# Patient Record
Sex: Female | Born: 1970 | Race: White | Hispanic: No | Marital: Married | Smoking: Never smoker
Health system: Southern US, Community
[De-identification: ages and names within clinical notes are randomized; demographics above are authoritative.]

## PROBLEM LIST (undated history)

## (undated) DIAGNOSIS — I10 Essential (primary) hypertension: Secondary | ICD-10-CM

## (undated) DIAGNOSIS — T7840XA Allergy, unspecified, initial encounter: Secondary | ICD-10-CM

## (undated) DIAGNOSIS — G43909 Migraine, unspecified, not intractable, without status migrainosus: Secondary | ICD-10-CM

## (undated) DIAGNOSIS — D649 Anemia, unspecified: Secondary | ICD-10-CM

## (undated) DIAGNOSIS — E162 Hypoglycemia, unspecified: Secondary | ICD-10-CM

## (undated) DIAGNOSIS — R011 Cardiac murmur, unspecified: Secondary | ICD-10-CM

## (undated) DIAGNOSIS — F32A Depression, unspecified: Secondary | ICD-10-CM

## (undated) DIAGNOSIS — O039 Complete or unspecified spontaneous abortion without complication: Secondary | ICD-10-CM

## (undated) DIAGNOSIS — B019 Varicella without complication: Secondary | ICD-10-CM

## (undated) DIAGNOSIS — F329 Major depressive disorder, single episode, unspecified: Secondary | ICD-10-CM

## (undated) DIAGNOSIS — Z973 Presence of spectacles and contact lenses: Secondary | ICD-10-CM

## (undated) DIAGNOSIS — F419 Anxiety disorder, unspecified: Secondary | ICD-10-CM

## (undated) HISTORY — DX: Anemia, unspecified: D64.9

## (undated) HISTORY — DX: Allergy, unspecified, initial encounter: T78.40XA

## (undated) HISTORY — DX: Complete or unspecified spontaneous abortion without complication: O03.9

## (undated) HISTORY — DX: Essential (primary) hypertension: I10

## (undated) HISTORY — DX: Varicella without complication: B01.9

## (undated) HISTORY — DX: Hypoglycemia, unspecified: E16.2

## (undated) HISTORY — DX: Depression, unspecified: F32.A

## (undated) HISTORY — DX: Anxiety disorder, unspecified: F41.9

## (undated) HISTORY — DX: Major depressive disorder, single episode, unspecified: F32.9

## (undated) HISTORY — DX: Migraine, unspecified, not intractable, without status migrainosus: G43.909

## (undated) HISTORY — DX: Presence of spectacles and contact lenses: Z97.3

## (undated) HISTORY — DX: Cardiac murmur, unspecified: R01.1

---
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
## 2003-08-17 ENCOUNTER — Other Ambulatory Visit: Admission: RE | Admit: 2003-08-17 | Discharge: 2003-08-17 | Payer: Self-pay | Admitting: Obstetrics and Gynecology

## 2004-02-21 ENCOUNTER — Other Ambulatory Visit: Admission: RE | Admit: 2004-02-21 | Discharge: 2004-02-21 | Payer: Self-pay | Admitting: Obstetrics and Gynecology

## 2004-07-11 ENCOUNTER — Other Ambulatory Visit: Admission: RE | Admit: 2004-07-11 | Discharge: 2004-07-11 | Payer: Self-pay | Admitting: Obstetrics and Gynecology

## 2004-11-21 ENCOUNTER — Other Ambulatory Visit: Admission: RE | Admit: 2004-11-21 | Discharge: 2004-11-21 | Payer: Self-pay | Admitting: Obstetrics and Gynecology

## 2005-07-28 ENCOUNTER — Inpatient Hospital Stay (HOSPITAL_COMMUNITY): Admission: AD | Admit: 2005-07-28 | Discharge: 2005-07-30 | Payer: Self-pay | Admitting: Obstetrics and Gynecology

## 2005-09-16 ENCOUNTER — Other Ambulatory Visit: Admission: RE | Admit: 2005-09-16 | Discharge: 2005-09-16 | Payer: Self-pay | Admitting: Obstetrics and Gynecology

## 2006-09-08 DIAGNOSIS — O039 Complete or unspecified spontaneous abortion without complication: Secondary | ICD-10-CM

## 2006-09-08 HISTORY — PX: DILATION AND EVACUATION: SHX1459

## 2006-09-08 HISTORY — DX: Complete or unspecified spontaneous abortion without complication: O03.9

## 2009-05-03 ENCOUNTER — Ambulatory Visit (HOSPITAL_COMMUNITY): Admission: RE | Admit: 2009-05-03 | Discharge: 2009-05-03 | Payer: Self-pay | Admitting: Obstetrics and Gynecology

## 2009-05-03 ENCOUNTER — Encounter (INDEPENDENT_AMBULATORY_CARE_PROVIDER_SITE_OTHER): Payer: Self-pay | Admitting: Obstetrics and Gynecology

## 2010-12-14 LAB — CBC
MCHC: 34 g/dL (ref 30.0–36.0)
Platelets: 120 10*3/uL — ABNORMAL LOW (ref 150–400)
RBC: 4.33 MIL/uL (ref 3.87–5.11)
RDW: 12.1 % (ref 11.5–15.5)

## 2010-12-14 LAB — ABO/RH: ABO/RH(D): O POS

## 2011-01-21 NOTE — Op Note (Signed)
Beth Ballard, Beth Ballard             ACCOUNT NO.:  [id number]   MEDICAL RECORD NO.:  [id number]          PATIENT TYPE:  AMB   LOCATION:  SDC                           FACILITY:  [facility]   PHYSICIAN:  Michelle L. Grewal, M.D.DATE OF BIRTH:  Dec 24, 1970   DATE OF PROCEDURE:  05/03/2009  DATE OF DISCHARGE:                               OPERATIVE REPORT   PREOPERATIVE DIAGNOSIS:  Incomplete abortion.   POSTOPERATIVE DIAGNOSIS:  Incomplete abortion.   PROCEDURE:  D and E.   SURGEON:  Michelle L. [name], MD   ANESTHESIA:  Local.   FINDINGS:  Cervix slightly open, tissue consistent with POC.   SPECIMENS:  Products of conception sent to pathology.   ESTIMATED BLOOD LOSS:  Minimal.   COMPLICATIONS:  None.   PROCEDURE:  The patient was taken to the operating room after she had  been thoroughly consented.  She was given anesthesia.  She was prepped  and draped, and a time-out was performed.  In-and-out catheter was  performed.  It was noted the patient was bleeding some, and she had  reported to me this morning she had started bleeding at 3 o'clock this  morning with cramping.  A speculum was inserted into the vagina, and the  cervix was slightly open.  I did see some tissues that looked like  products of conception right there at the os.  The cervix was grasped  with a tenaculum.  Paracervical block was performed in standard fashion.  The uterus did not even have to be sounded.  I easily inserted a #7  suction cannula, thoroughly suctioned the uterine cavity.  There were  some remaining products of conception noted.  A curette was inserted.  The uterus was thoroughly curetted, and then a final suction curettage  was performed, and the cavity was cleaned.  The patient tolerated the  procedure well.  All instruments were removed from the vagina.  All  sponge, lap, and instrument counts were correct x2.  The patient went to  recovery room in stable condition.      Michelle L. [name],  M.D.  Electronically Signed     MLG/MEDQ  D:  05/03/2009  T:  05/03/2009  Job:  [id number]

## 2012-01-06 ENCOUNTER — Other Ambulatory Visit: Payer: Self-pay | Admitting: Obstetrics and Gynecology

## 2013-01-05 ENCOUNTER — Other Ambulatory Visit: Payer: Self-pay | Admitting: Obstetrics and Gynecology

## 2014-03-15 ENCOUNTER — Other Ambulatory Visit: Payer: Self-pay | Admitting: Obstetrics and Gynecology

## 2014-03-17 LAB — CYTOLOGY - PAP

## 2014-11-07 DIAGNOSIS — Z973 Presence of spectacles and contact lenses: Secondary | ICD-10-CM

## 2014-11-07 HISTORY — DX: Presence of spectacles and contact lenses: Z97.3

## 2014-11-17 ENCOUNTER — Emergency Department (HOSPITAL_BASED_OUTPATIENT_CLINIC_OR_DEPARTMENT_OTHER): Payer: BLUE CROSS/BLUE SHIELD

## 2014-11-17 ENCOUNTER — Emergency Department (HOSPITAL_BASED_OUTPATIENT_CLINIC_OR_DEPARTMENT_OTHER)
Admission: EM | Admit: 2014-11-17 | Discharge: 2014-11-17 | Disposition: A | Discharge status: Home / Self Care | Payer: BLUE CROSS/BLUE SHIELD | Attending: Emergency Medicine | Admitting: Emergency Medicine

## 2014-11-17 ENCOUNTER — Encounter (HOSPITAL_BASED_OUTPATIENT_CLINIC_OR_DEPARTMENT_OTHER): Payer: Self-pay

## 2014-11-17 DIAGNOSIS — S199XXA Unspecified injury of neck, initial encounter: Secondary | ICD-10-CM | POA: Diagnosis not present

## 2014-11-17 DIAGNOSIS — Y9389 Activity, other specified: Secondary | ICD-10-CM | POA: Diagnosis not present

## 2014-11-17 DIAGNOSIS — S3992XA Unspecified injury of lower back, initial encounter: Secondary | ICD-10-CM | POA: Diagnosis not present

## 2014-11-17 DIAGNOSIS — Y92410 Unspecified street and highway as the place of occurrence of the external cause: Secondary | ICD-10-CM | POA: Diagnosis not present

## 2014-11-17 DIAGNOSIS — Y998 Other external cause status: Secondary | ICD-10-CM | POA: Insufficient documentation

## 2014-11-17 DIAGNOSIS — S59912A Unspecified injury of left forearm, initial encounter: Secondary | ICD-10-CM | POA: Insufficient documentation

## 2014-11-17 DIAGNOSIS — S0990XA Unspecified injury of head, initial encounter: Secondary | ICD-10-CM | POA: Diagnosis present

## 2014-11-17 DIAGNOSIS — S0181XA Laceration without foreign body of other part of head, initial encounter: Secondary | ICD-10-CM | POA: Diagnosis not present

## 2014-11-17 DIAGNOSIS — Z3202 Encounter for pregnancy test, result negative: Secondary | ICD-10-CM | POA: Diagnosis not present

## 2014-11-17 LAB — PREGNANCY, URINE: PREG TEST UR: NEGATIVE

## 2014-11-17 LAB — CBG MONITORING, ED: Glucose-Capillary: 91 mg/dL (ref 70–99)

## 2014-11-17 MED ORDER — METHOCARBAMOL 500 MG PO TABS: 500.0000 mg | ORAL_TABLET | Freq: Two times a day (BID) | ORAL | Status: DC

## 2014-11-17 MED ORDER — ONDANSETRON HCL 4 MG/2ML IJ SOLN: 4.0000 mg | Freq: Once | INTRAMUSCULAR | Status: AC

## 2014-11-17 MED ORDER — NAPROXEN 500 MG PO TABS: 500.0000 mg | ORAL_TABLET | Freq: Two times a day (BID) | ORAL | Status: DC

## 2014-11-17 MED ORDER — MORPHINE SULFATE 4 MG/ML IJ SOLN
4.0000 mg | Freq: Once | INTRAMUSCULAR | Status: AC
  Administered 2014-11-17: 4 mg via INTRAVENOUS
  Filled 2014-11-17: qty 1

## 2014-11-17 MED ORDER — ONDANSETRON HCL 4 MG/2ML IJ SOLN
4.0000 mg | Freq: Once | INTRAMUSCULAR | Status: AC
  Administered 2014-11-17: 4 mg via INTRAVENOUS
  Filled 2014-11-17: qty 2

## 2014-11-17 MED ORDER — ONDANSETRON HCL 4 MG/2ML IJ SOLN
INTRAMUSCULAR | Status: AC
  Filled 2014-11-17: qty 2

## 2014-11-17 MED ORDER — HYDROCODONE-ACETAMINOPHEN 5-325 MG PO TABS
1.0000 | ORAL_TABLET | Freq: Four times a day (QID) | ORAL | Status: DC | PRN
Start: 2014-11-17 — End: 2016-04-03

## 2014-11-17 MED ORDER — LORAZEPAM 2 MG/ML IJ SOLN
1.0000 mg | Freq: Once | INTRAMUSCULAR | Status: AC
  Administered 2014-11-17: 1 mg via INTRAVENOUS

## 2014-11-17 MED ORDER — LORAZEPAM 2 MG/ML IJ SOLN
INTRAMUSCULAR | Status: AC
  Filled 2014-11-17: qty 1

## 2014-11-17 MED ORDER — IOHEXOL 300 MG/ML  SOLN
100.0000 mL | Freq: Once | INTRAMUSCULAR | Status: AC | PRN
  Administered 2014-11-17: 100 mL via INTRAVENOUS
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
## 2014-11-17 NOTE — ED Notes (Signed)
Pt. Began to shake and cry with c/o feeling like she could not breath.  Pt. Hyperventilating.  PA C in room to access Pt. And meds ordered for Pt.   Pt. Given meds and now calm and resting.  Pt. Has 2 liters oxygen placed via N/C due to resting and oxygen sat on RA dropping low 90%.
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
## 2015-06-05 ENCOUNTER — Other Ambulatory Visit: Payer: Self-pay

## 2015-06-06 LAB — CYTOLOGY - PAP

## 2016-04-02 ENCOUNTER — Ambulatory Visit: Payer: BLUE CROSS/BLUE SHIELD | Admitting: Family Medicine

## 2016-04-03 ENCOUNTER — Ambulatory Visit (INDEPENDENT_AMBULATORY_CARE_PROVIDER_SITE_OTHER): Payer: BLUE CROSS/BLUE SHIELD | Admitting: Family Medicine

## 2016-04-03 ENCOUNTER — Encounter: Payer: Self-pay | Admitting: Family Medicine

## 2016-04-03 VITALS — BP 132/81 | HR 90 | Temp 98.3°F | Resp 20 | Ht 67.0 in | Wt 121.2 lb

## 2016-04-03 DIAGNOSIS — F418 Other specified anxiety disorders: Secondary | ICD-10-CM

## 2016-04-03 DIAGNOSIS — Z7689 Persons encountering health services in other specified circumstances: Secondary | ICD-10-CM

## 2016-04-03 DIAGNOSIS — R41842 Visuospatial deficit: Secondary | ICD-10-CM | POA: Insufficient documentation

## 2016-04-03 DIAGNOSIS — F0781 Postconcussional syndrome: Secondary | ICD-10-CM | POA: Diagnosis not present

## 2016-04-03 DIAGNOSIS — S069XAA Unspecified intracranial injury with loss of consciousness status unknown, initial encounter: Secondary | ICD-10-CM | POA: Insufficient documentation

## 2016-04-03 DIAGNOSIS — H538 Other visual disturbances: Secondary | ICD-10-CM

## 2016-04-03 DIAGNOSIS — S069X2S Unspecified intracranial injury with loss of consciousness of 31 minutes to 59 minutes, sequela: Secondary | ICD-10-CM

## 2016-04-03 DIAGNOSIS — R29898 Other symptoms and signs involving the musculoskeletal system: Secondary | ICD-10-CM | POA: Insufficient documentation

## 2016-04-03 DIAGNOSIS — M6289 Other specified disorders of muscle: Secondary | ICD-10-CM

## 2016-04-03 DIAGNOSIS — R51 Headache: Secondary | ICD-10-CM

## 2016-04-03 DIAGNOSIS — S069X9A Unspecified intracranial injury with loss of consciousness of unspecified duration, initial encounter: Secondary | ICD-10-CM | POA: Insufficient documentation

## 2016-04-03 DIAGNOSIS — Z7189 Other specified counseling: Secondary | ICD-10-CM

## 2016-04-03 DIAGNOSIS — G8929 Other chronic pain: Secondary | ICD-10-CM

## 2016-04-03 NOTE — Patient Instructions (Signed)
I will need to follow yearly for physical and fasting labs.  I will request all your records and if there are any gaps in your health maintenance  we will catch you up at your physical.  If you need anything please do not hesitate to call.   It was a pleasure to meet you today.
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
## 2016-05-19 ENCOUNTER — Ambulatory Visit (INDEPENDENT_AMBULATORY_CARE_PROVIDER_SITE_OTHER): Payer: BLUE CROSS/BLUE SHIELD

## 2016-05-19 DIAGNOSIS — Z23 Encounter for immunization: Secondary | ICD-10-CM | POA: Diagnosis not present
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
## 2016-10-07 ENCOUNTER — Telehealth: Payer: Self-pay | Admitting: Adult Health

## 2016-10-07 NOTE — Telephone Encounter (Signed)
Hi [name],  Beth Ballard called today states she is out of the medication you prescribed while her provider at [facility] req'd you call her at  [phone]  To advise what she needs to do-- [facility] but they wouldn't call it in for her.  Sorry to add more work to you today :-(  [name]

## 2017-01-07 ENCOUNTER — Telehealth: Payer: Self-pay | Admitting: Family Medicine

## 2017-01-07 NOTE — Telephone Encounter (Signed)
Called pt back to follow up on providers response. Pt is grateful.

## 2017-01-07 NOTE — Telephone Encounter (Signed)
Pt called in, she would like to switch from Dr. Claiborne Billings to Dr. Patsy Lager. Pt says that the [facility] office is closer for her. Is this switch okay?    If so, Dr. Patsy Lager is a 30 minute transition of care visit needed?    I will call pt back to make her aware once advised.   [phone]

## 2017-01-07 NOTE — Telephone Encounter (Signed)
That is ok with me- does not need the 30 minute appt

## 2017-01-07 NOTE — Telephone Encounter (Signed)
It's ok with me. 

## 2017-09-18 ENCOUNTER — Telehealth: Payer: Self-pay | Admitting: *Deleted
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
## 2018-06-21 DIAGNOSIS — J209 Acute bronchitis, unspecified: Secondary | ICD-10-CM | POA: Diagnosis not present

## 2018-09-29 DIAGNOSIS — J0110 Acute frontal sinusitis, unspecified: Secondary | ICD-10-CM | POA: Diagnosis not present

## 2018-10-27 DIAGNOSIS — F0781 Postconcussional syndrome: Secondary | ICD-10-CM | POA: Diagnosis not present

## 2018-10-27 DIAGNOSIS — F419 Anxiety disorder, unspecified: Secondary | ICD-10-CM | POA: Diagnosis not present

## 2018-10-27 DIAGNOSIS — R69 Illness, unspecified: Secondary | ICD-10-CM | POA: Diagnosis not present

## 2018-10-27 DIAGNOSIS — F5105 Insomnia due to other mental disorder: Secondary | ICD-10-CM | POA: Diagnosis not present

## 2019-10-21 ENCOUNTER — Ambulatory Visit: Payer: Self-pay | Attending: Internal Medicine

## 2019-10-21 DIAGNOSIS — Z23 Encounter for immunization: Secondary | ICD-10-CM | POA: Insufficient documentation

## 2019-10-21 NOTE — Progress Notes (Signed)
   Covid-19 Vaccination Clinic  Name:  Beth Ballard    MRN: [id number] DOB: [birth year]  10/21/2019  [name] was observed post Covid-19 immunization for 15 minutes without incidence. She was provided with Vaccine Information Sheet and instruction to access the V-Safe system.   [name] was instructed to call 911 with any severe reactions post vaccine: Marland Kitchen Difficulty breathing  . Swelling of your face and throat  . A fast heartbeat  . A bad rash all over your body  . Dizziness and weakness    Immunizations Administered    Name Date Dose VIS Date Route   Pfizer COVID-19 Vaccine 10/21/2019 10:17 AM 0.3 mL 08/19/2019 Intramuscular   Manufacturer: [employer], [employer]   Lot: [id number]   NDC: [address]-7

## 2019-11-13 ENCOUNTER — Ambulatory Visit: Payer: Self-pay | Attending: Internal Medicine

## 2019-11-13 DIAGNOSIS — Z23 Encounter for immunization: Secondary | ICD-10-CM | POA: Insufficient documentation

## 2019-11-13 NOTE — Progress Notes (Signed)
   Covid-19 Vaccination Clinic  Name:  Beth Ballard    MRN: [id number] DOB: 1970-10-17  11/13/2019  Beth Ballard was observed post Covid-19 immunization for 15 minutes without incident. She was provided with Vaccine Information Sheet and instruction to access the V-Safe system.   Beth Ballard was instructed to call 911 with any severe reactions post vaccine: Marland Kitchen Difficulty breathing  . Swelling of face and throat  . A fast heartbeat  . A bad rash all over body  . Dizziness and weakness   Immunizations Administered    Name Date Dose VIS Date Route   Pfizer COVID-19 Vaccine 11/13/2019  9:26 AM 0.3 mL 08/19/2019 Intramuscular   Manufacturer: [employer], [employer]   Lot: [id number]   NDC: [address]-9

## 2023-03-09 DIAGNOSIS — Z6820 Body mass index (BMI) 20.0-20.9, adult: Secondary | ICD-10-CM | POA: Diagnosis not present

## 2023-03-09 DIAGNOSIS — Z01419 Encounter for gynecological examination (general) (routine) without abnormal findings: Secondary | ICD-10-CM | POA: Diagnosis not present

## 2023-03-09 DIAGNOSIS — Z1231 Encounter for screening mammogram for malignant neoplasm of breast: Secondary | ICD-10-CM | POA: Diagnosis not present

## 2023-06-04 DIAGNOSIS — Z03818 Encounter for observation for suspected exposure to other biological agents ruled out: Secondary | ICD-10-CM | POA: Diagnosis not present

## 2023-06-04 DIAGNOSIS — R051 Acute cough: Secondary | ICD-10-CM | POA: Diagnosis not present

## 2023-06-04 DIAGNOSIS — J019 Acute sinusitis, unspecified: Secondary | ICD-10-CM | POA: Diagnosis not present

## 2023-09-21 DIAGNOSIS — Z806 Family history of leukemia: Secondary | ICD-10-CM | POA: Diagnosis not present

## 2023-09-21 DIAGNOSIS — Z0000 Encounter for general adult medical examination without abnormal findings: Secondary | ICD-10-CM | POA: Diagnosis not present

## 2023-09-21 DIAGNOSIS — Z13220 Encounter for screening for lipoid disorders: Secondary | ICD-10-CM | POA: Diagnosis not present

## 2023-09-21 DIAGNOSIS — Z23 Encounter for immunization: Secondary | ICD-10-CM | POA: Diagnosis not present

## 2023-12-28 ENCOUNTER — Encounter: Payer: Self-pay | Admitting: Gastroenterology

## 2024-01-11 ENCOUNTER — Encounter: Payer: Self-pay | Admitting: Gastroenterology

## 2024-01-11 ENCOUNTER — Ambulatory Visit: Payer: Self-pay | Admitting: Family Medicine

## 2024-01-11 ENCOUNTER — Ambulatory Visit (AMBULATORY_SURGERY_CENTER): Payer: Self-pay | Admitting: *Deleted

## 2024-01-11 VITALS — Ht 68.0 in | Wt 128.0 lb

## 2024-01-11 DIAGNOSIS — Z1211 Encounter for screening for malignant neoplasm of colon: Secondary | ICD-10-CM

## 2024-01-11 MED ORDER — NA SULFATE-K SULFATE-MG SULF 17.5-3.13-1.6 GM/177ML PO SOLN: 1.0000 | Freq: Once | ORAL | 0 refills | Status: AC

## 2024-01-11 NOTE — Progress Notes (Signed)
 Pre visit completed over telephone  Instructions through MyChart and secure email.     No egg or soy allergy known to patient  No issues known to pt with past sedation with any surgeries or procedures Patient denies ever being told they had issues or difficulty with intubation  No FH of Malignant Hyperthermia Pt is not on diet pills Pt is not on  home 02  Pt is not on blood thinners  Pt denies issues with constipation  No A fib or A flutter Have any cardiac testing pending-- no Pt instructed to use [email or web address] or GoodRx for a price reduction on prep

## 2024-01-15 ENCOUNTER — Encounter (HOSPITAL_COMMUNITY): Payer: Self-pay

## 2024-01-22 ENCOUNTER — Encounter: Payer: Self-pay | Admitting: Gastroenterology

## 2024-01-22 ENCOUNTER — Ambulatory Visit: Payer: Self-pay | Admitting: Gastroenterology

## 2024-01-22 VITALS — BP 113/66 | HR 77 | Temp 98.2°F | Resp 13 | Ht 68.0 in | Wt 128.0 lb

## 2024-01-22 DIAGNOSIS — Z1211 Encounter for screening for malignant neoplasm of colon: Secondary | ICD-10-CM

## 2024-01-22 DIAGNOSIS — Q438 Other specified congenital malformations of intestine: Secondary | ICD-10-CM | POA: Diagnosis not present

## 2024-01-22 MED ORDER — SODIUM CHLORIDE 0.9 % IV SOLN: 500.0000 mL | Freq: Once | INTRAVENOUS | Status: DC

## 2024-01-22 NOTE — Op Note (Signed)
 [facility] Patient Name: Beth Ballard Procedure Date: 01/22/2024 4:14 PM MRN: [id number] Endoscopist: Ace Abu L. Dominic Friendly , MD, [id number] Age: 53 Referring MD:  Date of Birth: [birth year] Gender: Female Account #: [id number] Procedure:                Colonoscopy Indications:              Screening for colorectal malignant neoplasm, This                            is the patient's first colonoscopy Medicines:                Monitored Anesthesia Care Procedure:                Pre-Anesthesia Assessment:                           - Prior to the procedure, a History and Physical                            was performed, and patient medications and                            allergies were reviewed. The patient's tolerance of                            previous anesthesia was also reviewed. The risks                            and benefits of the procedure and the sedation                            options and risks were discussed with the patient.                            All questions were answered, and informed consent                            was obtained. Prior Anticoagulants: The patient has                            taken no anticoagulant or antiplatelet agents. ASA                            Grade Assessment: II - A patient with mild systemic                            disease. After reviewing the risks and benefits,                            the patient was deemed in satisfactory condition to                            undergo the procedure.  After obtaining informed consent, the colonoscope                            was passed under direct vision. Throughout the                            procedure, the patient's blood pressure, pulse, and                            oxygen saturations were monitored continuously. The                            CF [id number] #[id number] was introduced through the anus                            and advanced to the  the terminal ileum, with                            identification of the appendiceal orifice and IC                            valve. The colonoscopy was performed with                            difficulty due to a redundant colon. Successful                            completion of the procedure was aided by                            straightening and shortening the scope to obtain                            bowel loop reduction. The patient tolerated the                            procedure well. The quality of the bowel                            preparation was good. The terminal ileum, ileocecal                            valve, appendiceal orifice, and rectum were                            photographed. Scope In: 4:29:09 PM Scope Out: 4:48:03 PM Scope Withdrawal Time: 0 hours 10 minutes 3 seconds  Total Procedure Duration: 0 hours 18 minutes 54 seconds  Findings:                 The perianal and digital rectal examinations were                            normal.  The terminal ileum appeared normal.                           Repeat examination of right colon under NBI                            performed.                           The colon (entire examined portion) was redundant.                           The exam was otherwise without abnormality on                            direct and retroflexion views. Complications:            No immediate complications. Estimated Blood Loss:     Estimated blood loss: none. Impression:               - The examined portion of the ileum was normal.                           - Redundant colon.                           - The examination was otherwise normal on direct                            and retroflexion views.                           - No specimens collected. Recommendation:           - Patient has a contact number available for                            emergencies. The signs and symptoms of potential                             delayed complications were discussed with the                            patient. Return to normal activities tomorrow.                            Written discharge instructions were provided to the                            patient.                           - Resume previous diet.                           - Continue present medications.                           -  Repeat colonoscopy in 10 years for screening                            purposes. Hayle Parisi L. Dominic Friendly, MD 01/22/2024 4:54:02 PM This report has been signed electronically.
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
## 2024-01-22 NOTE — Patient Instructions (Signed)

## 2024-01-22 NOTE — Progress Notes (Signed)
 Pt sedate, gd SR's, VSS, report to RN

## 2024-01-22 NOTE — Progress Notes (Signed)
 Pt's states no medical or surgical changes since previsit or office visit.

## 2024-01-25 ENCOUNTER — Telehealth: Payer: Self-pay

## 2024-01-25 NOTE — Telephone Encounter (Signed)
 Follow up call to pt, no answer.

## 2024-01-26 DIAGNOSIS — R030 Elevated blood-pressure reading, without diagnosis of hypertension: Secondary | ICD-10-CM | POA: Diagnosis not present

## 2024-01-26 DIAGNOSIS — F411 Generalized anxiety disorder: Secondary | ICD-10-CM | POA: Diagnosis not present

## 2024-04-04 DIAGNOSIS — Z6820 Body mass index (BMI) 20.0-20.9, adult: Secondary | ICD-10-CM | POA: Diagnosis not present

## 2024-04-04 DIAGNOSIS — Z124 Encounter for screening for malignant neoplasm of cervix: Secondary | ICD-10-CM | POA: Diagnosis not present

## 2024-04-04 DIAGNOSIS — Z1231 Encounter for screening mammogram for malignant neoplasm of breast: Secondary | ICD-10-CM | POA: Diagnosis not present

## 2024-04-04 DIAGNOSIS — Z01419 Encounter for gynecological examination (general) (routine) without abnormal findings: Secondary | ICD-10-CM | POA: Diagnosis not present
